# Patient Record
Sex: Male | Born: 2000 | Race: Black or African American | Hispanic: No | Marital: Single | Smoking: Never smoker
Health system: Southern US, Community
[De-identification: ages and names within clinical notes are randomized; demographics above are authoritative.]

## PROBLEM LIST (undated history)

## (undated) DIAGNOSIS — F840 Autistic disorder: Secondary | ICD-10-CM

---
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
## 2021-10-23 ENCOUNTER — Ambulatory Visit: Payer: Self-pay

## 2023-11-07 ENCOUNTER — Other Ambulatory Visit: Payer: Self-pay | Admitting: Family Medicine

## 2023-11-07 DIAGNOSIS — R7989 Other specified abnormal findings of blood chemistry: Secondary | ICD-10-CM

## 2023-11-11 ENCOUNTER — Ambulatory Visit
Admission: RE | Admit: 2023-11-11 | Discharge: 2023-11-11 | Disposition: A | Discharge status: Home / Self Care | Source: Ambulatory Visit | Attending: Family Medicine | Admitting: Family Medicine

## 2023-11-11 DIAGNOSIS — R7989 Other specified abnormal findings of blood chemistry: Secondary | ICD-10-CM
